# Patient Record
Sex: Male | Born: 1995 | Hispanic: No | Marital: Single | Smoking: Never smoker
Health system: Southern US, Community
[De-identification: ages and names within clinical notes are randomized; demographics above are authoritative.]

---
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
## 2014-08-08 ENCOUNTER — Emergency Department (HOSPITAL_COMMUNITY)
Admission: EM | Admit: 2014-08-08 | Discharge: 2014-08-08 | Disposition: A | Discharge status: Home / Self Care | Payer: BLUE CROSS/BLUE SHIELD | Attending: Emergency Medicine | Admitting: Emergency Medicine

## 2014-08-08 ENCOUNTER — Emergency Department (HOSPITAL_COMMUNITY): Payer: BLUE CROSS/BLUE SHIELD

## 2014-08-08 ENCOUNTER — Encounter (HOSPITAL_COMMUNITY): Payer: Self-pay | Admitting: Emergency Medicine

## 2014-08-08 DIAGNOSIS — Y92310 Basketball court as the place of occurrence of the external cause: Secondary | ICD-10-CM | POA: Insufficient documentation

## 2014-08-08 DIAGNOSIS — X58XXXA Exposure to other specified factors, initial encounter: Secondary | ICD-10-CM | POA: Diagnosis not present

## 2014-08-08 DIAGNOSIS — Y9367 Activity, basketball: Secondary | ICD-10-CM | POA: Diagnosis not present

## 2014-08-08 DIAGNOSIS — S99911A Unspecified injury of right ankle, initial encounter: Secondary | ICD-10-CM | POA: Diagnosis present

## 2014-08-08 DIAGNOSIS — S93401A Sprain of unspecified ligament of right ankle, initial encounter: Secondary | ICD-10-CM | POA: Diagnosis not present

## 2014-08-08 DIAGNOSIS — Y998 Other external cause status: Secondary | ICD-10-CM | POA: Diagnosis not present

## 2014-08-08 MED ORDER — HYDROCODONE-ACETAMINOPHEN 5-325 MG PO TABS: 1.0000 | ORAL_TABLET | Freq: Four times a day (QID) | ORAL | Status: DC | PRN

## 2014-08-08 MED ORDER — HYDROCODONE-ACETAMINOPHEN 5-325 MG PO TABS
1.0000 | ORAL_TABLET | Freq: Once | ORAL | Status: AC
  Administered 2014-08-08: 1 via ORAL
  Filled 2014-08-08: qty 1

## 2014-08-08 NOTE — ED Notes (Signed)
Patient transported to X-ray 

## 2014-08-08 NOTE — Discharge Instructions (Signed)

## 2014-08-08 NOTE — ED Provider Notes (Signed)
CSN: [id number]     Arrival date & time 08/08/14  0005 History   First MD Initiated Contact with Patient 08/08/14 0007     Chief Complaint  Patient presents with  . Ankle Injury     (Consider location/radiation/quality/duration/timing/severity/associated sxs/prior Treatment) HPI Comments: Patient was playing basketball and came down wrong light hyperextending his right ankle had immediate swelling.  He states he took a pack of frozen broccoli and applied directly to the ankle with no relief.  Has a previous history of ankle sprain to the same ankle several years ago  Patient is a 19 y.o. male presenting with lower extremity injury. The history is provided by the patient.  Ankle Injury This is a new problem. The current episode started today. The problem occurs constantly. The problem has been gradually worsening. Associated symptoms include joint swelling. The symptoms are aggravated by walking. He has tried nothing for the symptoms. The treatment provided no relief.    History reviewed. No pertinent past medical history. History reviewed. No pertinent past surgical history. History reviewed. No pertinent family history. History  Substance Use Topics  . Smoking status: Never Smoker   . Smokeless tobacco: Not on file  . Alcohol Use: No    Review of Systems  Constitutional: Negative for activity change and appetite change.  Musculoskeletal: Positive for joint swelling.  Skin: Negative for color change and wound.  All other systems reviewed and are negative.     Allergies  Review of patient's allergies indicates no known allergies.  Home Medications   Prior to Admission medications   Medication Sig Start Date End Date Taking? Authorizing Provider  HYDROcodone-acetaminophen (NORCO/VICODIN) 5-325 MG per tablet Take 1 tablet by mouth every 6 (six) hours as needed for moderate pain. 08/08/14   Earley Favor, NP   BP 145/68 mmHg  Pulse 61  Temp(Src) 98.6 F (37 C) (Oral)  Resp  20  SpO2 100% Physical Exam  Constitutional: He is oriented to person, place, and time. He appears well-developed and well-nourished.  HENT:  Head: Normocephalic.  Eyes: Pupils are equal, round, and reactive to light.  Neck: Normal range of motion.  Cardiovascular: Normal rate.   Pulmonary/Chest: Effort normal.  Musculoskeletal: He exhibits edema and tenderness.       Feet:  Neurological: He is alert and oriented to person, place, and time.  Skin: Skin is warm and dry. No erythema.  Nursing note and vitals reviewed.   ED Course  Procedures (including critical care time) Labs Review Labs Reviewed - No data to display  Imaging Review Dg Tibia/fibula Right  08/08/2014   CLINICAL DATA:  Right leg pain from the lower leg through the foot after basketball injury last night.  EXAM: RIGHT TIBIA AND FIBULA - 2 VIEW  COMPARISON:  None.  FINDINGS: Cortical margins of the tibia and fibula are intact. There is no fracture. Knee alignment is maintained. Lateral soft tissue edema noted distally.  IMPRESSION: Negative.  Soft tissue edema about the ankle.   Electronically Signed   By: Rubye Oaks M.D.   On: 08/08/2014 00:40   Dg Ankle Complete Right  08/08/2014   CLINICAL DATA:  Right leg pain from the lower leg through the foot after basketball injury last night.  EXAM: RIGHT ANKLE - COMPLETE 3+ VIEW  COMPARISON:  None.  FINDINGS: Marked lateral soft tissue edema. Small joint effusion. No fracture or dislocation. The alignment and joint spaces are maintained. The ankle mortise is preserved.  IMPRESSION: Marked lateral soft  tissue edema and small joint effusion. No acute fracture. The degree of soft tissue edema raises concern for ligamentous injury.   Electronically Signed   By: Rubye Oaks M.D.   On: 08/08/2014 00:41   Dg Foot Complete Right  08/08/2014   CLINICAL DATA:  Right leg pain from the lower leg through the foot after basketball injury last night.  EXAM: RIGHT FOOT COMPLETE - 3+  VIEW  COMPARISON:  None.  FINDINGS: No fracture or dislocation. The alignment and joint spaces are maintained. Soft tissue edema noted laterally.  IMPRESSION: Negative.   Electronically Signed   By: Rubye Oaks M.D.   On: 08/08/2014 00:42     EKG Interpretation None     Visually, place and ASO given crutches and pain medication, follow-up with orthopedic surgery MDM   Final diagnoses:  Ankle sprain, right, initial encounter         Earley Favor, NP 08/08/14 [id number]  Devoria Albe, MD 08/08/14 [phone]

## 2014-08-08 NOTE — ED Notes (Signed)
Bed: RU04 Expected date:  Expected time:  Means of arrival:  Comments: Expected patient- injured knee/ankle

## 2014-08-08 NOTE — ED Notes (Addendum)
Pt was playing basketball last night and "landed wrong" on right ankle. Grossly swollen and slightly deformed. No other c/c. Able to wiggle toes. No previous injury to foot or ankle, denies previous surgeries.

## 2014-08-09 ENCOUNTER — Encounter (HOSPITAL_BASED_OUTPATIENT_CLINIC_OR_DEPARTMENT_OTHER): Payer: Self-pay

## 2021-03-09 ENCOUNTER — Emergency Department (HOSPITAL_BASED_OUTPATIENT_CLINIC_OR_DEPARTMENT_OTHER)
Admission: EM | Admit: 2021-03-09 | Discharge: 2021-03-09 | Disposition: A | Discharge status: Home / Self Care | Payer: PRIVATE HEALTH INSURANCE | Attending: Emergency Medicine | Admitting: Emergency Medicine

## 2021-03-09 ENCOUNTER — Encounter (HOSPITAL_BASED_OUTPATIENT_CLINIC_OR_DEPARTMENT_OTHER): Payer: Self-pay | Admitting: Urology

## 2021-03-09 ENCOUNTER — Emergency Department (HOSPITAL_BASED_OUTPATIENT_CLINIC_OR_DEPARTMENT_OTHER): Payer: PRIVATE HEALTH INSURANCE

## 2021-03-09 ENCOUNTER — Other Ambulatory Visit: Payer: Self-pay

## 2021-03-09 DIAGNOSIS — J069 Acute upper respiratory infection, unspecified: Secondary | ICD-10-CM | POA: Diagnosis not present

## 2021-03-09 DIAGNOSIS — U071 COVID-19: Secondary | ICD-10-CM | POA: Diagnosis not present

## 2021-03-09 DIAGNOSIS — R509 Fever, unspecified: Secondary | ICD-10-CM | POA: Diagnosis present

## 2021-03-09 LAB — RESP PANEL BY RT-PCR (FLU A&B, COVID) ARPGX2
Influenza A by PCR: NEGATIVE
Influenza B by PCR: NEGATIVE
SARS Coronavirus 2 by RT PCR: POSITIVE — AB

## 2021-03-09 MED ORDER — ACETAMINOPHEN 500 MG PO TABS
1000.0000 mg | ORAL_TABLET | Freq: Once | ORAL | Status: AC
  Administered 2021-03-09: 1000 mg via ORAL
  Filled 2021-03-09: qty 2

## 2021-03-09 MED ORDER — HYDROCOD POLI-CHLORPHE POLI ER 10-8 MG/5ML PO SUER
5.0000 mL | Freq: Once | ORAL | Status: AC
  Administered 2021-03-09: 5 mL via ORAL
  Filled 2021-03-09: qty 5

## 2021-03-09 MED ORDER — HYDROCOD POLI-CHLORPHE POLI ER 10-8 MG/5ML PO SUER: 5.0000 mL | Freq: Two times a day (BID) | ORAL | 0 refills | Status: AC | PRN

## 2021-03-09 NOTE — ED Provider Notes (Signed)
? ?[facility] ?Provider Note: Calvin Dell, MD, FACEP ? ?CSN: [id number] ?MRN: [id number] ?ARRIVAL: 03/09/21 at 0008 ?ROOM: [facility] ? ? ?CHIEF COMPLAINT  ?covid symptoms  ? ? ?HISTORY OF PRESENT ILLNESS  ?03/09/21 1:58 AM ?Calvin Phillips is a 26 y.o. male without significant past medical history.  He is here with chills, fever to 103, cough, body aches and sore throat that began 4 days ago.  He states at home COVID test was positive.  He rates his body aches as a 6 out of 10.  He has been taking DayQuil, NyQuil, ibuprofen and Tylenol for his symptoms without complete relief.  His last dose of Tylenol PM was yesterday about 8:30 PM.  He denies nausea and vomiting but has had diarrhea. ? ? ?History reviewed. No pertinent past medical history. ? ?History reviewed. No pertinent surgical history. ? ?Family History  ?Problem Relation Age of Onset  ? Sudden death Neg Hx   ? Heart attack Neg Hx   ? ? ?Social History  ? ?Tobacco Use  ? Smoking status: Never  ?Substance Use Topics  ? Alcohol use: No  ? Drug use: No  ? ? ?Prior to Admission medications   ?Medication Sig Start Date End Date Taking? Authorizing Provider  ?HYDROcodone-acetaminophen (NORCO/VICODIN) 5-325 MG per tablet Take 1 tablet by mouth every 6 (six) hours as needed for moderate pain. 08/08/14   Earley Favor, NP  ? ? ?Allergies ?Patient has no known allergies. ? ? ?REVIEW OF SYSTEMS  ?Negative except as noted here or in the History of Present Illness. ? ? ?PHYSICAL EXAMINATION  ?Initial Vital Signs ?Blood pressure 126/78, pulse 92, temperature (!) 100.4 ?F (38 ?C), temperature source Oral, resp. rate 18, height 5\' 11"  (1.803 m), weight 77.1 kg, SpO2 97 %. ? ?Examination ?General: Well-developed, well-nourished male in no acute distress; appearance consistent with age of record ?HENT: normocephalic; atraumatic ?Eyes: Normal appearance ?Neck: supple ?Heart: regular rate and rhythm ?Lungs: clear to auscultation bilaterally; rattly cough ?Abdomen:  soft; nondistended; nontender; bowel sounds present ?Extremities: No deformity; full range of motion ?Neurologic: Awake, alert and oriented; motor function intact in all extremities and symmetric; no facial droop ?Skin: Warm and dry ?Psychiatric: Flat affect ? ? ?RESULTS  ?Summary of this visit's results, reviewed and interpreted by myself: ? ? EKG Interpretation ? ?Date/Time:    ?Ventricular Rate:    ?PR Interval:    ?QRS Duration:   ?QT Interval:    ?QTC Calculation:   ?R Axis:     ?Text Interpretation:   ?  ? ?  ? ?Laboratory Studies: ?Results for orders placed or performed during the hospital encounter of 03/09/21 (from the past 24 hour(s))  ?Resp Panel by RT-PCR (Flu A&B, Covid) Nasopharyngeal Swab     Status: Abnormal  ? Collection Time: 03/09/21 12:19 AM  ? Specimen: Nasopharyngeal Swab; Nasopharyngeal(NP) swabs in vial transport medium  ?Result Value Ref Range  ? SARS Coronavirus 2 by RT PCR POSITIVE (A) NEGATIVE  ? Influenza A by PCR NEGATIVE NEGATIVE  ? Influenza B by PCR NEGATIVE NEGATIVE  ? ?Imaging Studies: ?DG Chest Port 1 View ? ?Result Date: 03/09/2021 ?CLINICAL DATA:  Cough and COVID-19 EXAM: PORTABLE CHEST 1 VIEW COMPARISON:  None. FINDINGS: The heart size and mediastinal contours are within normal limits. Both lungs are clear. The visualized skeletal structures are unremarkable. IMPRESSION: No active disease. Electronically Signed   By: 05/09/2021 M.D.   On: 03/09/2021 02:33   ? ?ED COURSE and  MDM  ?Nursing notes, initial and subsequent vitals signs, including pulse oximetry, reviewed and interpreted by myself. ? ?Vitals:  ? 03/09/21 0015 03/09/21 0017  ?BP:  126/78  ?Pulse:  92  ?Resp:  18  ?Temp:  (!) 100.4 ?F (38 ?C)  ?TempSrc:  Oral  ?SpO2:  97%  ?Weight: 77.1 kg   ?Height: 5\' 11"  (1.803 m)   ? ?Medications  ?chlorpheniramine-HYDROcodone 10-8 MG/5ML suspension 5 mL (5 mLs Oral Given 03/09/21 0212)  ?acetaminophen (TYLENOL) tablet 1,000 mg (1,000 mg Oral Given 03/09/21 0212)  ? ?Patient is  positive for COVID.  His chest x-ray is clear.  He states his cough is his most frustrating symptom and we will provide him with Tussionex for symptomatic relief. ? ? ?PROCEDURES  ?Procedures ? ? ?ED DIAGNOSES  ? ?  ICD-10-CM   ?1. Acute respiratory disease due to COVID-19 virus  U07.1   ? J06.9   ?  ? ? ? ?  ?0213, MD ?03/09/21 0244 ? ?

## 2021-03-09 NOTE — ED Triage Notes (Signed)
States chills, fever of 102, sore throat that started on Sunday night  ?Home covid test positive  ?

## 2021-03-09 NOTE — ED Notes (Signed)
Patient discharged to home.  All discharge instructions reviewed.  Patient verbalized understanding via teachback method.  VS WDL.  Respirations even and unlabored.  Ambulatory out of ED.   °

## 2021-03-09 NOTE — ED Notes (Signed)
Took 2 tylenol PM approx 2 hrs ago  ?

## 2023-10-17 IMAGING — DX DG CHEST 1V PORT
1 series · 1 of 1 positions shown · non-contrast
Comparison: None.

CLINICAL DATA: Cough and 3OPVT-FH

EXAM:
PORTABLE CHEST 1 VIEW

[chest ap]
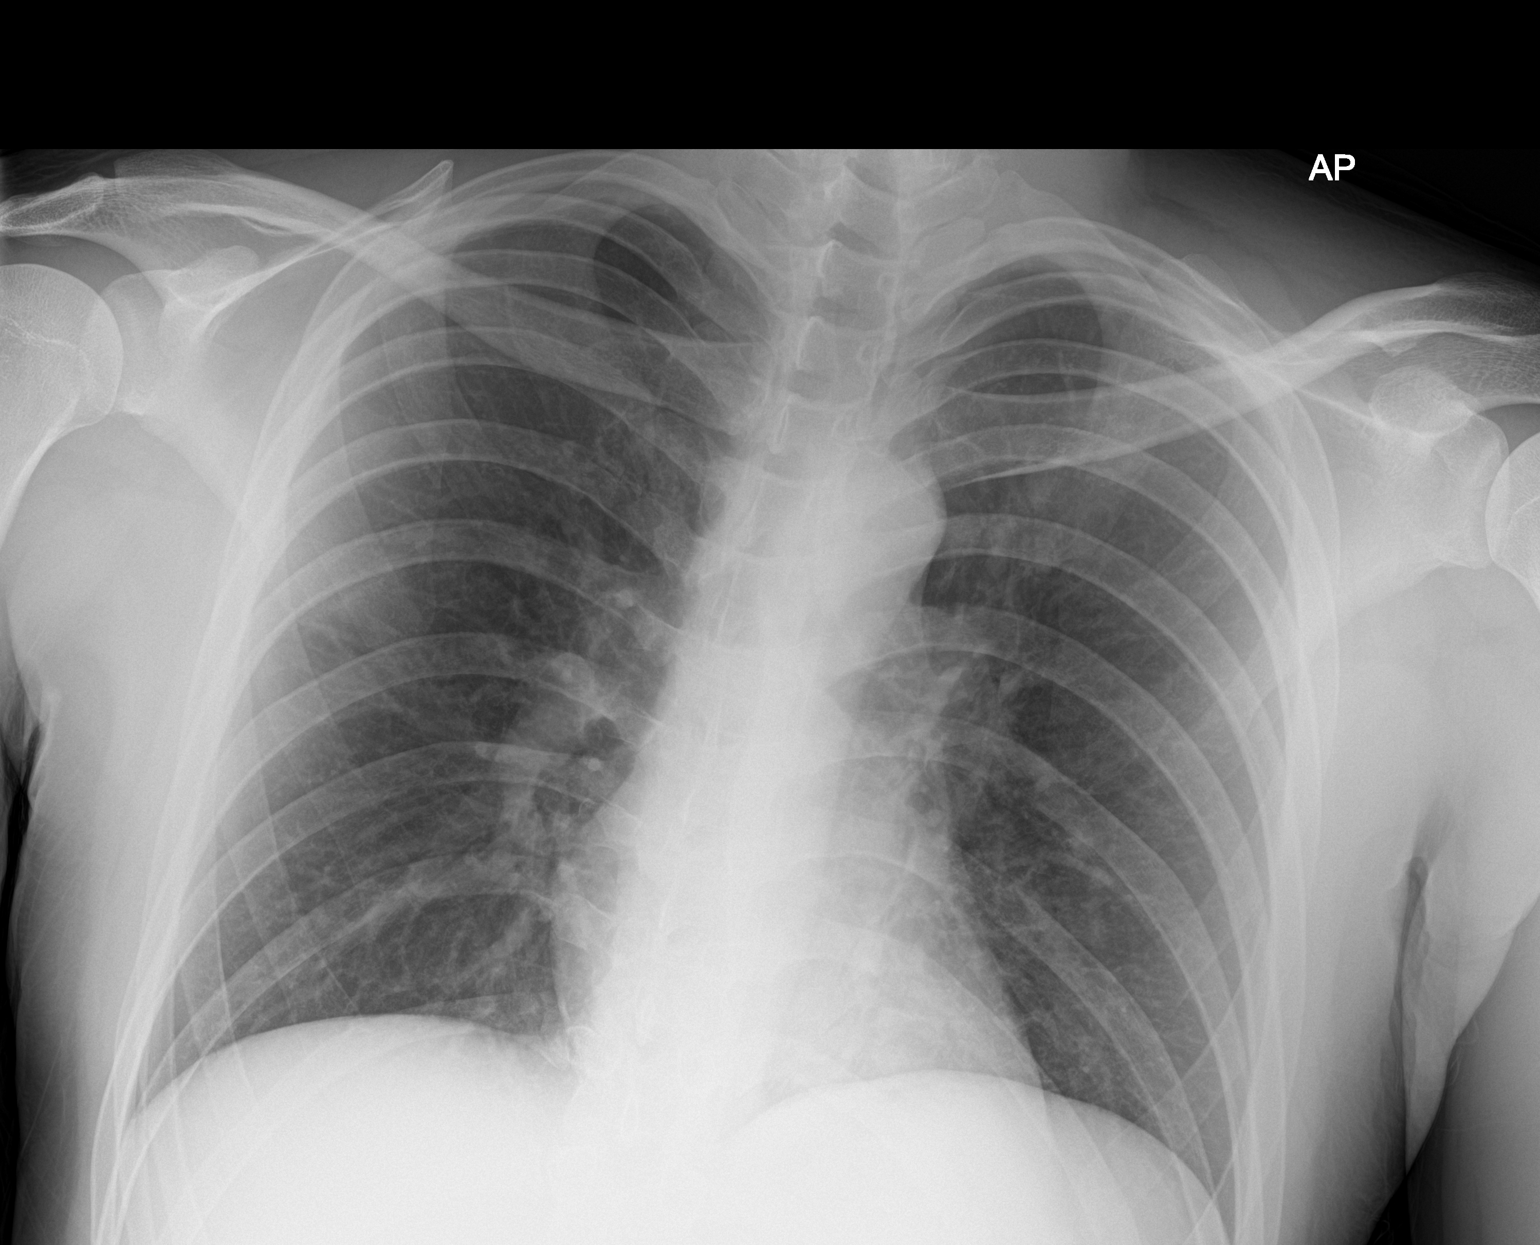

[1 of 1 positions shown; findings below may reference images not displayed]

FINDINGS: The heart size and mediastinal contours are within normal limits.
Both lungs are clear. The visualized skeletal structures are
unremarkable.
IMPRESSION: No active disease.
